# Patient Record
Sex: Male | Born: 2000 | Hispanic: No | Marital: Single | Smoking: Current every day smoker
Health system: Southern US, Community
[De-identification: ages and names within clinical notes are randomized; demographics above are authoritative.]

---
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
## 2019-02-07 ENCOUNTER — Other Ambulatory Visit: Payer: Self-pay | Admitting: Family Medicine

## 2024-07-26 ENCOUNTER — Other Ambulatory Visit: Payer: Self-pay | Admitting: Family Medicine

## 2024-07-26 ENCOUNTER — Encounter: Payer: Self-pay | Admitting: Family Medicine

## 2024-07-26 DIAGNOSIS — R2231 Localized swelling, mass and lump, right upper limb: Secondary | ICD-10-CM

## 2024-07-27 ENCOUNTER — Ambulatory Visit
Admission: RE | Admit: 2024-07-27 | Discharge: 2024-07-27 | Disposition: A | Discharge status: Home / Self Care | Source: Ambulatory Visit | Attending: Family Medicine | Admitting: Family Medicine

## 2024-07-27 ENCOUNTER — Other Ambulatory Visit: Payer: Self-pay | Admitting: Family Medicine

## 2024-07-27 DIAGNOSIS — R2231 Localized swelling, mass and lump, right upper limb: Secondary | ICD-10-CM | POA: Diagnosis present

## 2024-08-09 NOTE — Progress Notes (Signed)
 Jimmy Wilkie POUR, MD sent to Jimmy Fernandez Approved for US  CORE BX.  No sedation.  HKM       Previous Messages    ----- Message ----- From: Jimmy Fernandez Sent: 08/09/2024   1:30 PM EST To: [name] Karalee, MD Subject: ATTN: Dr VEAR Jimmy   US  Biopsy              IR Approval Request:   Procedure:    US  guided core RT axillary LN Biopsy  Reason:        RT axillary lymph node  History:        US  soft tissue upper RT extremity LTD   07/27/2024  Provider:     Dr Lucas Aloysius Catchings, MD  Provider Contact #      [facility]   gen surgery  [phone]

## 2024-08-10 ENCOUNTER — Other Ambulatory Visit: Payer: Self-pay | Admitting: General Surgery

## 2024-08-10 DIAGNOSIS — R590 Localized enlarged lymph nodes: Secondary | ICD-10-CM

## 2024-08-16 ENCOUNTER — Ambulatory Visit
